# Patient Record
Sex: Female | Born: 2000 | Race: White | Hispanic: No | Marital: Single | Smoking: Never smoker
Health system: Southern US, Community
[De-identification: ages and names within clinical notes are randomized; demographics above are authoritative.]

---
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
## 2021-05-18 IMAGING — DX DG OS CALCIS 2+V*R*
2 series · 2 of 2 positions shown · non-contrast
Comparison: None.

CLINICAL DATA: Right heel pain

EXAM:
RIGHT OS CALCIS - 2+ VIEW

[calcaneus axial]
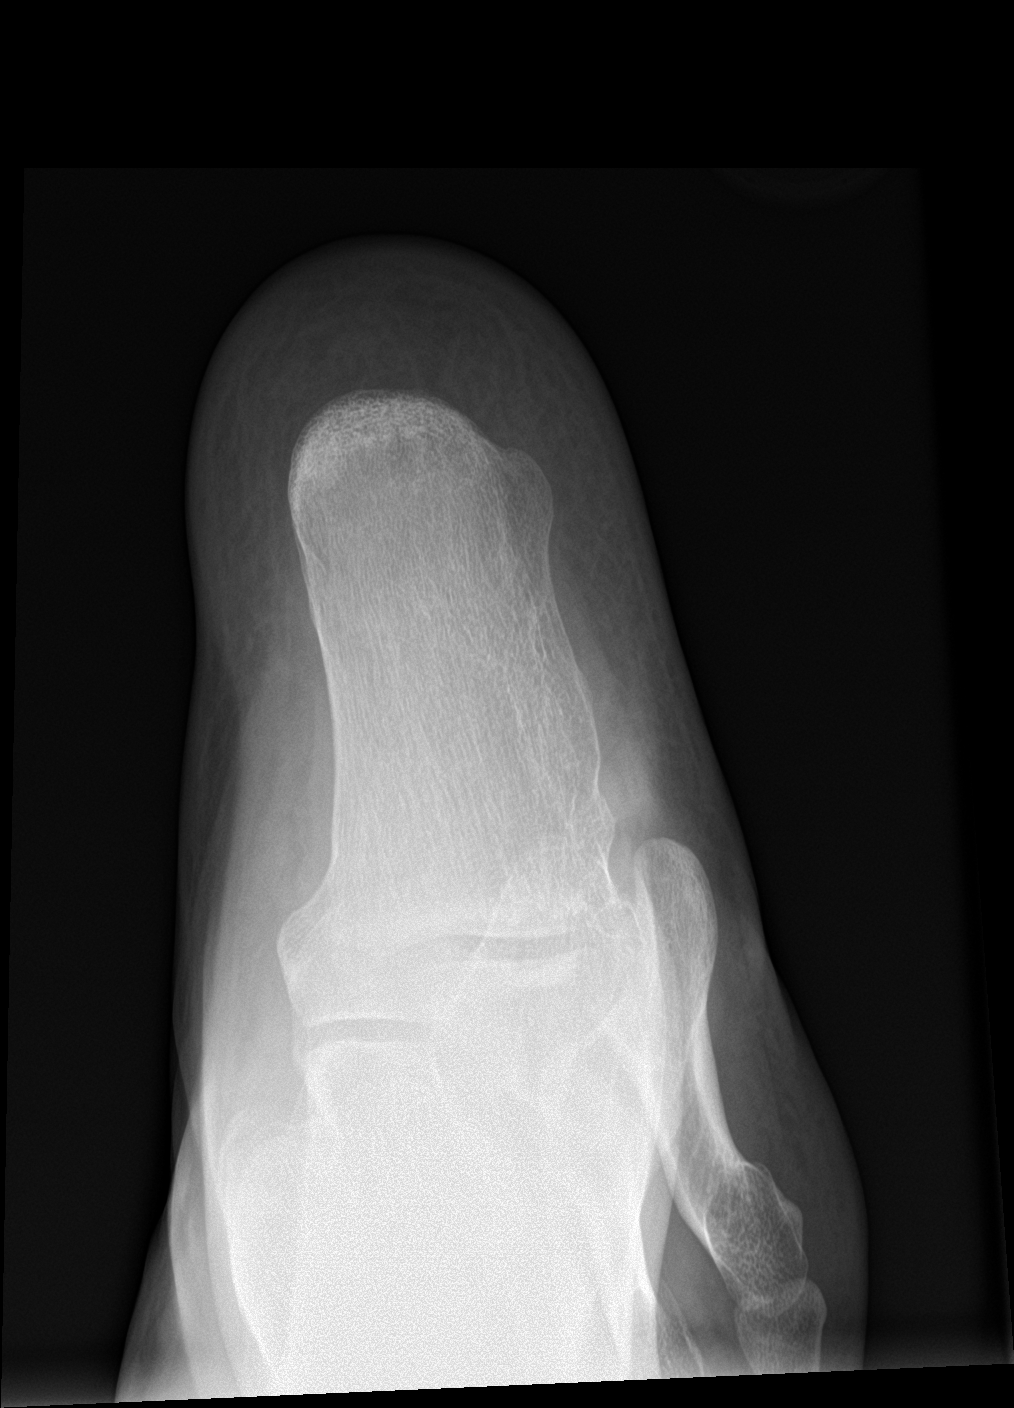

[calcaneus lat]
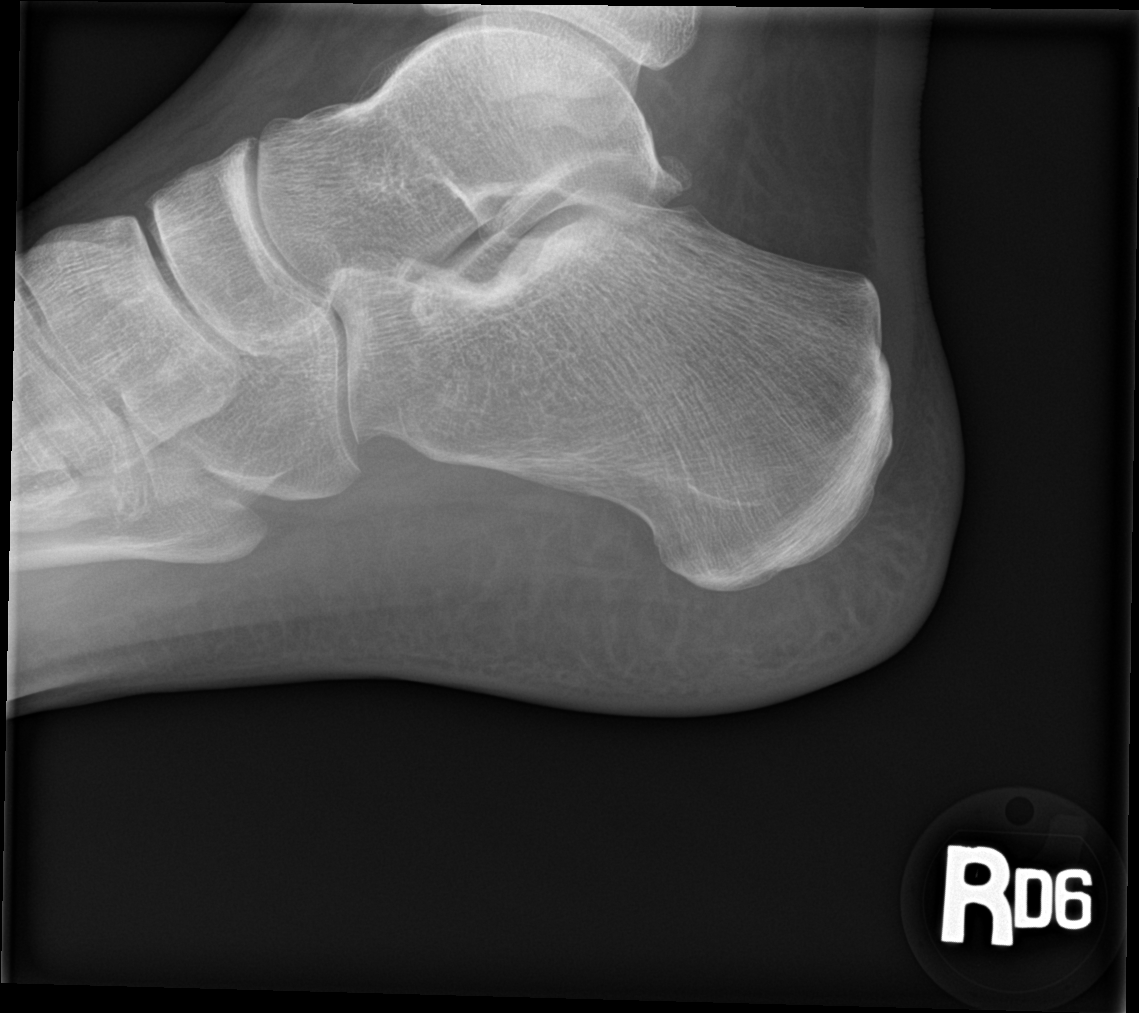

[2 of 2 positions shown; findings below may reference images not displayed]

FINDINGS: There is no evidence of fracture or other focal bone lesions. Soft
tissues are unremarkable.
IMPRESSION: Negative.

## 2021-09-27 ENCOUNTER — Emergency Department (HOSPITAL_BASED_OUTPATIENT_CLINIC_OR_DEPARTMENT_OTHER)
Admission: EM | Admit: 2021-09-27 | Discharge: 2021-09-28 | Disposition: A | Discharge status: Home / Self Care | Payer: BC Managed Care – PPO | Attending: Emergency Medicine | Admitting: Emergency Medicine

## 2021-09-27 ENCOUNTER — Other Ambulatory Visit: Payer: Self-pay

## 2021-09-27 DIAGNOSIS — R1031 Right lower quadrant pain: Secondary | ICD-10-CM | POA: Insufficient documentation

## 2021-09-27 DIAGNOSIS — G8929 Other chronic pain: Secondary | ICD-10-CM

## 2021-09-27 DIAGNOSIS — R059 Cough, unspecified: Secondary | ICD-10-CM | POA: Diagnosis not present

## 2021-09-27 DIAGNOSIS — D72829 Elevated white blood cell count, unspecified: Secondary | ICD-10-CM | POA: Insufficient documentation

## 2021-09-27 DIAGNOSIS — R300 Dysuria: Secondary | ICD-10-CM | POA: Diagnosis not present

## 2021-09-27 LAB — CBC WITH DIFFERENTIAL/PLATELET
Abs Immature Granulocytes: 0.03 10*3/uL (ref 0.00–0.07)
Basophils Absolute: 0 10*3/uL (ref 0.0–0.1)
Basophils Relative: 0 %
Eosinophils Absolute: 0.2 10*3/uL (ref 0.0–0.5)
Eosinophils Relative: 1 %
HCT: 40.6 % (ref 36.0–46.0)
Hemoglobin: 14.2 g/dL (ref 12.0–15.0)
Immature Granulocytes: 0 %
Lymphocytes Relative: 23 %
Lymphs Abs: 2.7 10*3/uL (ref 0.7–4.0)
MCH: 30.9 pg (ref 26.0–34.0)
MCHC: 35 g/dL (ref 30.0–36.0)
MCV: 88.5 fL (ref 80.0–100.0)
Monocytes Absolute: 1 10*3/uL (ref 0.1–1.0)
Monocytes Relative: 8 %
Neutro Abs: 8.1 10*3/uL — ABNORMAL HIGH (ref 1.7–7.7)
Neutrophils Relative %: 68 %
Platelets: 271 10*3/uL (ref 150–400)
RBC: 4.59 MIL/uL (ref 3.87–5.11)
RDW: 12.4 % (ref 11.5–15.5)
WBC: 12 10*3/uL — ABNORMAL HIGH (ref 4.0–10.5)
nRBC: 0 % (ref 0.0–0.2)

## 2021-09-27 LAB — URINALYSIS, ROUTINE W REFLEX MICROSCOPIC
Bilirubin Urine: NEGATIVE
Glucose, UA: NEGATIVE mg/dL
Hgb urine dipstick: NEGATIVE
Ketones, ur: NEGATIVE mg/dL
Leukocytes,Ua: NEGATIVE
Nitrite: NEGATIVE
Protein, ur: NEGATIVE mg/dL
Specific Gravity, Urine: 1.025 (ref 1.005–1.030)
pH: 7 (ref 5.0–8.0)

## 2021-09-27 LAB — PREGNANCY, URINE: Preg Test, Ur: NEGATIVE

## 2021-09-27 NOTE — ED Triage Notes (Addendum)
Pt complains of RLQ pain radiating to the right and left flanks. Reports the pain started when she would cough . Reports having a "cold". Pt states now the pain is consistent even when  she is not coughing . Reports burning on micturition ,and smelly urine .Denies nausea or vomiting . Abdomen is non distended.

## 2021-09-28 ENCOUNTER — Emergency Department (HOSPITAL_BASED_OUTPATIENT_CLINIC_OR_DEPARTMENT_OTHER): Payer: BC Managed Care – PPO

## 2021-09-28 ENCOUNTER — Encounter (HOSPITAL_BASED_OUTPATIENT_CLINIC_OR_DEPARTMENT_OTHER): Payer: Self-pay | Admitting: Emergency Medicine

## 2021-09-28 LAB — COMPREHENSIVE METABOLIC PANEL
ALT: 25 U/L (ref 0–44)
AST: 23 U/L (ref 15–41)
Albumin: 4.1 g/dL (ref 3.5–5.0)
Alkaline Phosphatase: 55 U/L (ref 38–126)
Anion gap: 7 (ref 5–15)
BUN: 13 mg/dL (ref 6–20)
CO2: 22 mmol/L (ref 22–32)
Calcium: 9 mg/dL (ref 8.9–10.3)
Chloride: 109 mmol/L (ref 98–111)
Creatinine, Ser: 0.59 mg/dL (ref 0.44–1.00)
GFR, Estimated: >60 mL/min (ref >60)
Glucose, Bld: 102 mg/dL — ABNORMAL HIGH (ref 70–99)
Potassium: 3.8 mmol/L (ref 3.5–5.1)
Sodium: 138 mmol/L (ref 135–145)
Total Bilirubin: 0.2 mg/dL — ABNORMAL LOW (ref 0.3–1.2)
Total Protein: 7 g/dL (ref 6.5–8.1)

## 2021-09-28 MED ORDER — KETOROLAC TROMETHAMINE 15 MG/ML IJ SOLN
15.0000 mg | Freq: Once | INTRAMUSCULAR | Status: AC
  Administered 2021-09-28: 15 mg via INTRAVENOUS
  Filled 2021-09-28: qty 1

## 2021-09-28 MED ORDER — IOHEXOL 300 MG/ML  SOLN
100.0000 mL | Freq: Once | INTRAMUSCULAR | Status: AC | PRN
  Administered 2021-09-28: 100 mL via INTRAVENOUS

## 2021-09-28 NOTE — ED Provider Notes (Signed)
[facility] EMERGENCY DEPARTMENT Provider Note   CSN: [id number] Arrival date & time: 09/27/21  2255     History  Chief Complaint  Patient presents with   RLQ abdominal pain     Joan Pitts is a 21 y.o. female.  The history is provided by the patient.  Joan Pitts is a 21 y.o. female who presents to the Emergency Department complaining of abdominal pain.  She presents to the emergency department for evaluation of lower abdominal pain that started this morning.  Pain is on the right lower abdomen but radiates across and is worse with cough, walking and bending over.  She does have a mild cough and cold.  No fever.  No nausea, vomiting.  She has had dysuria today and noticed that her urine looked cloudy.  No abnormal vaginal discharge.  No new sexual partners.   No medical problems.  No surgeries.  Lmp two weeks ago.       Home Medications Prior to Admission medications   Medication Sig Start Date End Date Taking? Authorizing Provider  naproxen (NAPROSYN) 500 MG tablet Take 1 tablet (500 mg total) by mouth 2 (two) times daily. 04/27/19   Petrucelli, Pleas Koch, PA-C      Allergies    Patient has no known allergies.    Review of Systems   Review of Systems  All other systems reviewed and are negative.   Physical Exam Updated Vital Signs BP 113/65 (BP Location: Left Arm)   Pulse 85   Temp 98 F (36.7 C)   Resp 20   Ht 5\' 7"  (1.702 m)   Wt 68 kg   SpO2 100%   BMI 23.49 kg/m  Physical Exam Vitals and nursing note reviewed.  Constitutional:      Appearance: She is well-developed.  HENT:     Head: Normocephalic and atraumatic.  Cardiovascular:     Rate and Rhythm: Normal rate and regular rhythm.  Pulmonary:     Effort: Pulmonary effort is normal. No respiratory distress.  Abdominal:     Palpations: Abdomen is soft.     Tenderness: There is abdominal tenderness. There is no guarding or rebound.     Comments: Lower abdominal tenderness, left greater than  right  Musculoskeletal:        General: No tenderness.  Skin:    General: Skin is warm and dry.  Neurological:     Mental Status: She is alert and oriented to person, place, and time.  Psychiatric:        Behavior: Behavior normal.     ED Results / Procedures / Treatments   Labs (all labs ordered are listed, but only abnormal results are displayed) Labs Reviewed  URINALYSIS, ROUTINE W REFLEX MICROSCOPIC - Abnormal; Notable for the following components:      Result Value   APPearance CLOUDY (*)    All other components within normal limits  CBC WITH DIFFERENTIAL/PLATELET - Abnormal; Notable for the following components:   WBC 12.0 (*)    Neutro Abs 8.1 (*)    All other components within normal limits  PREGNANCY, URINE  COMPREHENSIVE METABOLIC PANEL    EKG None  Radiology No results found.  Procedures Procedures    Medications Ordered in ED Medications - No data to display  ED Course/ Medical Decision Making/ A&P                           Medical Decision Making  Amount and/or Complexity of Data Reviewed Radiology: ordered.  Risk Prescription drug management.   Patient here for evaluation of lower abdominal pain, worse with ambulation and coughing.  She does have tenderness on examination without peritoneal findings.  CBC with mild leukocytosis.  UA is cloudy but without evidence of UTI.  CT abdomen pelvis was obtained, which is negative for acute appendicitis or clear source of her pain. No palpated hernias on examination.  Discussed with patient likely abdominal wall strain secondary to her recent URI.  Discussed return precautions for any progressive or new concerning symptoms.        Final Clinical Impression(s) / ED Diagnoses Final diagnoses:  None    Rx / DC Orders ED Discharge Orders     None         Quintella Reichert, MD 09/28/21 [phone]

## 2022-02-08 ENCOUNTER — Other Ambulatory Visit: Payer: Self-pay

## 2022-02-08 ENCOUNTER — Encounter (HOSPITAL_BASED_OUTPATIENT_CLINIC_OR_DEPARTMENT_OTHER): Payer: Self-pay | Admitting: Pediatrics

## 2022-02-08 ENCOUNTER — Emergency Department (HOSPITAL_BASED_OUTPATIENT_CLINIC_OR_DEPARTMENT_OTHER): Payer: BC Managed Care – PPO

## 2022-02-08 ENCOUNTER — Emergency Department (HOSPITAL_BASED_OUTPATIENT_CLINIC_OR_DEPARTMENT_OTHER)
Admission: EM | Admit: 2022-02-08 | Discharge: 2022-02-08 | Disposition: A | Discharge status: Home / Self Care | Payer: BC Managed Care – PPO | Attending: Emergency Medicine | Admitting: Emergency Medicine

## 2022-02-08 DIAGNOSIS — R059 Cough, unspecified: Secondary | ICD-10-CM | POA: Diagnosis present

## 2022-02-08 DIAGNOSIS — J209 Acute bronchitis, unspecified: Secondary | ICD-10-CM | POA: Diagnosis not present

## 2022-02-08 MED ORDER — BENZONATATE 100 MG PO CAPS: 100.0000 mg | ORAL_CAPSULE | Freq: Three times a day (TID) | ORAL | 0 refills | Status: AC

## 2022-02-08 NOTE — Discharge Instructions (Signed)
Please pickup the anticough medication I have prescribed for you. You may also try cough drops and honey. Exercise as tolerated. Please make an appointment with your primary care provider to be seen when you return to [place] to be rechecked. If symptoms worsen such as shortness of breath, fever, increasing pain, troubling swallowing, please return to ER.

## 2022-02-08 NOTE — ED Provider Notes (Signed)
[facility] Provider Note   CSN: [id number] Arrival date & time: 02/08/22  1410     History  Chief Complaint  Patient presents with   Cough    Joan Pitts is a 22 y.o. female who presented for a cough that has been present for 1 month. Patient reported being sick with a viral illness back in December and has had a cough since then. Patient was recently placed on Augmentin on 02/01/22 and has been compliant. Chest pain is localized to R side of rib cage and is 7/10. Patient endorsed productive cough with yellow sputum and chest pain associated with the coughing. Patient noted these symptoms are exacerbated with exercise.   Patient denied fever, N/V, syncope, changes in sensation/motor skills, abd pain, dysuria, diarrhea, HA, blurry vision, hemoptysis, hematemesis  Home Medications Prior to Admission medications   Medication Sig Start Date End Date Taking? Authorizing Provider  benzonatate (TESSALON) 100 MG capsule Take 1 capsule (100 mg total) by mouth every 8 (eight) hours. 02/08/22  Yes Robey Massmann, Florene Route, PA-C  naproxen (NAPROSYN) 500 MG tablet Take 1 tablet (500 mg total) by mouth 2 (two) times daily. 04/27/19   Petrucelli, Glynda Jaeger, PA-C      Allergies    Patient has no known allergies.    Review of Systems   Review of Systems  Respiratory:  Positive for cough.   Cardiovascular:  Positive for chest pain.  See HPI  Physical Exam Updated Vital Signs BP 111/68   Pulse 65   Temp 98 F (36.7 C)   Resp 20   Ht 5\' 6"  (1.676 m)   Wt 68 kg   LMP 02/05/2022   SpO2 100%   BMI 24.21 kg/m  Physical Exam Vitals and nursing note reviewed.  Constitutional:      General: She is not in acute distress.    Appearance: She is well-developed.  HENT:     Head: Normocephalic and atraumatic.  Eyes:     Extraocular Movements: Extraocular movements intact.     Conjunctiva/sclera: Conjunctivae normal.     Pupils: Pupils are equal, round, and  reactive to light.  Cardiovascular:     Rate and Rhythm: Normal rate and regular rhythm.     Heart sounds: No murmur heard. Pulmonary:     Effort: Pulmonary effort is normal. No respiratory distress.     Breath sounds: Normal breath sounds.  Abdominal:     Palpations: Abdomen is soft.     Tenderness: There is no abdominal tenderness.  Musculoskeletal:        General: No swelling.     Cervical back: Neck supple.  Skin:    General: Skin is warm and dry.     Capillary Refill: Capillary refill takes less than 2 seconds.  Neurological:     General: No focal deficit present.     Mental Status: She is alert and oriented to person, place, and time.  Psychiatric:        Mood and Affect: Mood normal.     ED Results / Procedures / Treatments   Labs (all labs ordered are listed, but only abnormal results are displayed) Labs Reviewed - No data to display  EKG None  Radiology DG Chest 2 View  Result Date: 02/08/2022 CLINICAL DATA:  Intermittent cough and flu symptoms since Christmas EXAM: CHEST - 2 VIEW COMPARISON:  04/27/2019 FINDINGS: The heart size and mediastinal contours are within normal limits. Both lungs are clear. The  visualized skeletal structures are unremarkable. IMPRESSION: No active cardiopulmonary disease. Electronically Signed   By: Randa Ngo M.D.   On: 02/08/2022 14:55    Procedures Procedures    Medications Ordered in ED Medications - No data to display  ED Course/ Medical Decision Making/ A&P                             Medical Decision Making Amount and/or Complexity of Data Reviewed Radiology: ordered.  Risk Prescription drug management.   Wilburt Finlay 21 y.o. presented today for cough and chest pain. Working DDx that I considered at this time includes, but not limited to, URI, PNA, acute bronchitis.  Review of prior external notes: 02/01/22 Office visit  Unique Tests and My Interpretation:  CXR: negative  Discussion with Independent Historian:  none  Discussion of Management of Tests: none  Risk:   Medium:  - prescription drug management  Risk Stratification Score: none  R/o DDx: URI: patient's vitals are stable and she does endorse a history of fevers, weakness to suggest this PNA: CXR was negative  Plan: Patient presented with a cough that has been present for one month. Patient's vitals are stable and physical exam was unremarkable. I suspect at this time patient has acute bronchitis and will prescribe tessalon for symptom management. I also encouraged cough drops, honey, and fluids as well. I encouraged the patient to follow up with her PCP in [place] as she said she will be back there soon to be reevaluated. I answered all the patient's questions to her satisfaction and patient is stable for discharge. Patient was given return precautions. Patient verbalized agreement to this plan.         Final Clinical Impression(s) / ED Diagnoses Final diagnoses:  Acute bronchitis, unspecified organism    Rx / DC Orders ED Discharge Orders          Ordered    benzonatate (TESSALON) 100 MG capsule  Every 8 hours        02/08/22 1528              Elvina Sidle 02/08/22 1603    Hayden Rasmussen, MD 02/09/22 1007

## 2022-02-08 NOTE — ED Triage Notes (Signed)
Reports has some on and off cough since having some flu / cold like symptoms since Christmas time. Reports she's currently on an ABX course but she doesn't seem to be getting better; reports worst with activity.

## 2022-02-08 NOTE — ED Notes (Signed)
Reviewed discharge instructions and medication. Pt states understanding
